# Patient Record
Sex: Female | Born: 1975
Health system: Southern US, Community
[De-identification: ages and names within clinical notes are randomized; demographics above are authoritative.]

## PROBLEM LIST (undated history)

## (undated) DIAGNOSIS — O24419 Gestational diabetes mellitus in pregnancy, unspecified control: Secondary | ICD-10-CM

## (undated) HISTORY — PX: DILATION AND CURETTAGE OF UTERUS: SHX78

## (undated) HISTORY — PX: WISDOM TOOTH EXTRACTION: SHX21

---

## 2005-07-24 ENCOUNTER — Other Ambulatory Visit: Admission: RE | Admit: 2005-07-24 | Discharge: 2005-07-24 | Payer: Self-pay | Admitting: Family Medicine

## 2005-09-28 ENCOUNTER — Encounter (INDEPENDENT_AMBULATORY_CARE_PROVIDER_SITE_OTHER): Payer: Self-pay | Admitting: Specialist

## 2005-09-28 ENCOUNTER — Ambulatory Visit (HOSPITAL_COMMUNITY): Admission: RE | Admit: 2005-09-28 | Discharge: 2005-09-28 | Payer: Self-pay | Admitting: Obstetrics & Gynecology

## 2006-04-13 ENCOUNTER — Inpatient Hospital Stay (HOSPITAL_COMMUNITY): Admission: AD | Admit: 2006-04-13 | Discharge: 2006-04-13 | Payer: Self-pay | Admitting: Obstetrics and Gynecology

## 2006-06-12 ENCOUNTER — Ambulatory Visit (HOSPITAL_COMMUNITY): Admission: RE | Admit: 2006-06-12 | Discharge: 2006-06-12 | Payer: Self-pay | Admitting: Obstetrics and Gynecology

## 2006-06-12 ENCOUNTER — Encounter (INDEPENDENT_AMBULATORY_CARE_PROVIDER_SITE_OTHER): Payer: Self-pay | Admitting: Specialist

## 2007-03-07 ENCOUNTER — Encounter: Admission: RE | Admit: 2007-03-07 | Discharge: 2007-03-07 | Payer: Self-pay | Admitting: Obstetrics and Gynecology

## 2007-06-02 ENCOUNTER — Inpatient Hospital Stay (HOSPITAL_COMMUNITY): Admission: AD | Admit: 2007-06-02 | Discharge: 2007-06-06 | Payer: Self-pay | Admitting: Pediatrics

## 2007-06-07 ENCOUNTER — Ambulatory Visit: Admission: RE | Admit: 2007-06-07 | Discharge: 2007-06-07 | Payer: Self-pay | Admitting: Obstetrics and Gynecology

## 2008-11-21 ENCOUNTER — Inpatient Hospital Stay (HOSPITAL_COMMUNITY): Admission: AD | Admit: 2008-11-21 | Discharge: 2008-11-21 | Payer: Self-pay | Admitting: Pediatrics

## 2010-09-06 NOTE — H&P (Signed)
NAMEHEIDIE, KRALL             ACCOUNT NO.:  [id number]   MEDICAL RECORD NO.:  [id number]          PATIENT TYPE:  INP   LOCATION:  9162                          [facility]  [facility]   PHYSICIAN:  Naima A. Dillard, M.D. DATE OF BIRTH:  1975-10-11   DATE OF ADMISSION:  06/02/2007  DATE OF DISCHARGE:                              HISTORY & PHYSICAL   This is a 35 year old, gravida 3, para 0-0-2-0 at 41-3/7 weeks who  presents for induction of labor secondary to post dates. She denies  leaking or bleeding and reports positive fetal movement.  The pregnancy  has been followed by the nurse midwife service and remarkable for:  1. SAB x2.  2. Group B strep in urine.  3. Previous LGA now AGA   ALLERGIES:  None.   OBSTETRIC HISTORY:  Remarkable for SAB in June 2007 at 10 weeks and SAB  in December 2007 at 6 weeks with no complications.   MEDICAL HISTORY:  Remarkable for childhood varicella.   SURGICAL HISTORY:  Remarkable for a D&C in 2007, cyst removal in 2007  and wisdom teeth in 1996.   FAMILY HISTORY:  Remarkable for an uncle with heart attack.  Mother and  grandmother with varicose veins.  Father with anemia, grandfather with  diabetes.  Father with Graves' disease and kidney cancer.  Grandmother  with Alzheimer's. Grandparents with stroke. Follows aunt with rheumatoid  arthritis.   GENETIC HISTORY:  Remarkable for a brother and grandmother with heart  murmur.   SOCIAL HISTORY:  The patient is married to Juliene Pina who is involved  and supportive.  She is of the [place] faith.  She denies any alcohol,  tobacco or drug use.   PRENATAL LABS:  Hemoglobin 14.9, platelets 232.  Blood type AB positive,  antibody screen negative, RPR nonreactive, rubella immune.  Hepatitis  negative, HIV negative.   HISTORY OF CURRENT PREGNANCY:  The patient entered care at 10 weeks.  She had an ultrasound to confirm dates.  She declined quad screen.  Ultrasound at 19 weeks was normal.  She had  some dysuria and a UTI at 26  weeks. That urinary tract infection grew out group B strep. A 1-hour  Glucola was 150 and a 3-hour GTT was normal.  She did see a nutritionist  because she was found to have an LGA baby at 90th percentile. A later  ultrasound at 37 weeks showed 74% growth and she presents today for  induction.   OBJECTIVE:  VITAL SIGNS:  Stable, afebrile.  HEENT:  Within normal limits.  Thyroid normal not enlarged.  CHEST:  Clear to auscultation.  HEART:  Regular rate and rhythm.  ABDOMEN:  Gravid, 40 cm, vertex Leopold's. EFM shows reactive fetal  heart rate with rare contractions.  Cervix is 1, 85% effaced, -1 to zero  station, vertex presentation, anterior and soft with a Bishop score of  10.  EXTREMITIES:  Within normal limits.   ASSESSMENT:  1. Intrauterine pregnancy at 41-3/7 weeks.  2. Post dates with favorable cervix.   PLAN:  1. Admit per Dr. Normand Sloop.  2. Routine CNM orders.  3. Will give 2 milliunits per minute Pitocin throughout the night and      advance in the morning.      Marie L. Williams, C.N.M.      Naima A. Normand Sloop, M.D.  Electronically Signed    MLW/MEDQ  D:  06/02/2007  T:  06/03/2007  Job:  [id number]

## 2010-09-09 NOTE — H&P (Signed)
NAMEANNALEIA, PENCE             ACCOUNT NO.:  [id number]   MEDICAL RECORD NO.:  [id number]          PATIENT TYPE:  AMB   LOCATION:                                [facility]   PHYSICIAN:  Naima A. Dillard, M.D. DATE OF BIRTH:  1975/12/01   DATE OF ADMISSION:  06/12/2006  DATE OF DISCHARGE:                              HISTORY & PHYSICAL   HISTORY OF PRESENT ILLNESS:  Mrs. Eberle is a 35 year old married white  female, para 0-0-2-0, who presents for operative laparoscopy to evaluate  a right adnexal mass.  Over the past year, patient has experienced 2  spontaneous abortions.  Her first was an intrauterine fetal demise at 66  weeks in early 2007, and most recently in December of 2007 she  miscarried at 4 weeks, 4 days gestation.  During her evaluations in  December, 2 ultrasound reports confirmed a persistent cystic right  adnexal mass measuring 4.8 x 3.7 x 3.7 cm.  This mass was associated  with some discomfort, but patient denied any urinary tract symptoms,  changes in bowel movements, vaginitis symptoms, chest pain, shortness of  breath or fever.  A follow-up ultrasound on May 21, 2006 again  demonstrated a right cystic structure measuring 4.3 x 3.5 x 4.9 cm.  There was no free fluid in the cul-de-sac nor septations or color  Doppler flow within the mass.  A CBC with differential was within normal  limits, and gonorrhea and chlamydia cultures were both negative.  Patient was given the options of observation or surgical exploration for  management of this right adnexal mass; however, she has chosen to  proceed with surgical intervention.   PAST MEDICAL HISTORY:  OB history:  Gravida 2, para 0-0-2-0.  Patient  has had 2 spontaneous abortions (see history of present illness).   GYNECOLOGICAL HISTORY:  Menarche 35 years old, last menstrual period  February 25, 2006.  Denies any history of sexually transmitted diseases  or abnormal Pap smears.  Most recent normal Pap smear was  April 2007.   MEDICAL HISTORY:  Scoliosis.   SURGICAL HISTORY:  2007 D&C, dilatation and curettage.   FAMILY HISTORY:  Thyroid disease, asthma, hypertension, cardiovascular  disease, diabetes mellitus and stroke.   SOCIAL HISTORY:  Patient is married, and she works at [employer].   HABITS:  She does not use tobacco or alcohol.   CURRENT MEDICATIONS:  Multivitamins.   ALLERGIES:  SHE HAS NO KNOWN DRUG ALLERGIES.   SOCIAL HISTORY:  She does not use tobacco or alcohol.   REVIEW OF SYSTEMS:  All negative, except as mentioned in history of  present illness.   PHYSICAL EXAMINATION:  VITAL SIGNS:  Blood pressure 110/80, weight 142  pounds, height 5 feet 6 inches tall.  GENERAL:  Neck:  There is no thyromegaly or cervical adenopathy.  HEART:  Regular rate and rhythm.  LUNGS:  Clear.  BACK:  No CVA tenderness.  ABDOMEN:  No tenderness masses or organomegaly.  EXTREMITIES:  No clubbing, cyanosis or edema.  PELVIC:  EGBUS is within normal limits.  Vagina is rugose.  Cervix is  nontender  without lesions.  Uterus appears normal size, shape and  consistency without tenderness.  Adnexa with no palpable tenderness or  masses.   IMPRESSION:  Right adnexal mass.   DISPOSITION:  A discussion was held with patient regarding the  indications for her procedure, along with this list which includes but  are not limited to reaction to anesthesia, damage to adjacent organs,  infection, excessive bleeding and a possible need to remove her right  tube and ovary.  Patient verbalized understanding of these risks and has  consented to proceed with an operational laparoscopy at [facility]  of [place] on June 12, 2006 at 10:00 a.m.      Elmira J. Adline Peals.      Naima A. Normand Sloop, M.D.  Electronically Signed    EJP/MEDQ  D:  06/07/2006  T:  06/07/2006  Job:  [id number]

## 2010-09-09 NOTE — Op Note (Signed)
NAMECHIARA, Ashley Ho             ACCOUNT NO.:  [id number]   MEDICAL RECORD NO.:  [id number]          PATIENT TYPE:  AMB   LOCATION:  SDC                           [facility]   PHYSICIAN:  Naima A. Dillard, M.D. DATE OF BIRTH:  Aug 03, 1975   DATE OF PROCEDURE:  06/12/2006  DATE OF DISCHARGE:                               OPERATIVE REPORT   PREOPERATIVE DIAGNOSIS:  Persistent right adnexal mass.   POSTOPERATIVE DIAGNOSIS:  Right simple cyst.   PROCEDURE:  Fluoroscopy, right tubal cystectomy and drainage of cyst.   SURGEON:  Naima A. Normand Sloop, M.D.   ASSISTANT:  Caren Griffins, C.N.M.   ANESTHESIA:  General.   SPECIMENS:  Right tubal cyst to pathology with fluid.   ESTIMATED BLOOD LOSS:  150 mL.   URINE OUTPUT:  200 mL.   INTRAVENOUS FLUIDS:  1200 mL.   COMPLICATIONS:  None.   DISPOSITION:  Patient to PACU in stable condition.   PROCEDURE IN DETAIL:  The patient was taken to the operating room where  she was given general anesthesia, placed in dorsal lithotomy position  and prepped and draped in normal sterile fashion.  A Foley catheter was  placed and a bivalved speculum was placed into the vagina.  The anterior  lip of the cervix was grasped with a single-toothed tenaculum and an  Acorn was attached to the tenaculum as a means to manipulate the uterus.  Attention was then turned to the umbilicus, where 5 mL of 0.25% Marcaine  with epinephrine was used right below the umbilicus.  A 2 mm incision  was made just below the umbilicus, and carried down through the fascia.  The fascia was incised in the midline and extended bilaterally.  The  peritoneum was identified and tented up sharply.  A pursestring suture  was placed around the fascia.  The Hasson was placed into the abdominal  cavity with anchoring to the suture and the fascia.  The abdomen was  insufflated with CO2 gas.  A laparoscope was placed and the findings  were as follows.  The patient's appendix was normal,  the patient's  abdominal anatomy was normal.  The patient's left tube and ovary were  normal.  Her posterior cul-de-sac, anterior cul-de-sac and uterus  appeared normal.  Her right ovary was normal.  In the mesosalpinx of the  tube was about a 4 cm simple-appearing cyst.  I put two, 5 mm trocars in  the right and left lower quadrant of the abdomen and there was a thin  layer of serosa around the cyst.  This was entered sharply with  Metzenbaums and extended away from the tube and the cyst was enucleated  out using counter pressure with two, atraumatic forceps and also  hydrodissection with the Nagat .  There was some bleeding in the  mesosalpinx area, which was made hemostatic with Gelfoam and Kleppinger  forceps.  The body of the tube and fimbriae were never burned or injured  in any way.  At the end of the case, I did take pictures to show that  once the cyst was enucleated from the  mesosalpinx, the right tube was  placed in the cul-de-sac, a needle was placed into the cyst and drained  of clear, straw fluid and the cyst was removed.  The operative port and  laparoscope let the air out and then re-insufflated to make sure there  was no bleeding along the mesosalpinx of the right tube.  Irrigation was  done.  Gelfoam was placed to the mid salpinx.  Sponge, lap and needle  counts were correct.  All trocars were removed with direct visualization  and hemostasis noted.  The umbilical port fascia was reapproximated by  tying the suture.  There was a small gap, so I put in another figure-of-  eight suture with 0 Vicryl.  The umbilical skin incision was closed with  3-0 Monocryl.  The small, 5 mm trocar skin incisions were closed with  Dermabond.  The tenaculum was removed and good hemostasis was noted.  The acorn was removed.  Sponge, lap and needle counts were correct.  The  patient went to the recovery room in stable condition.      Naima A. Normand Sloop, M.D.  Electronically Signed      NAD/MEDQ  D:  06/12/2006  T:  06/12/2006  Job:  [id number]

## 2010-09-09 NOTE — Discharge Summary (Signed)
NAMEPRAIRIE, STENBERG             ACCOUNT NO.:  [id number]   MEDICAL RECORD NO.:  [id number]          PATIENT TYPE:  INP   LOCATION:  9135                          [facility]   PHYSICIAN:  Janine Limbo, M.D.DATE OF BIRTH:  [birth year]   DATE OF ADMISSION:  06/02/2007  DATE OF DISCHARGE:  06/06/2007                               DISCHARGE SUMMARY   ADMITTING DIAGNOSES:  1. Intrauterine pregnancy at 41-3/7th's weeks.  2. Group B strep in urine.  3. History of spontaneous abortion x2.  4. Previous large for gestational age but now appropriate for      gestational age.  5. Bishop score equal to 10.   DISCHARGE DIAGNOSES:  1. Intrauterine pregnancy at term.  2. Status post vacuum extraction delivery.  3. Breast feeding and pumping.  4. Slightly low platelet count.  5. Postpartum anemia without hemodynamic instability.   PROCEDURES:  1. Epidural.  2. Vacuum extraction.   HOSPITAL COURSE:  The patient was admitted on February 8th for induction  of labor secondary to 41-3/7th's weeks and Bishop score equal to 10.  Ms. Deats is a 35 year old G3, P0-0-2-0 and on admission cervix was 1-  cm, 80-90% effaced, minus 1 to 0 vertex, anterior, and soft.  Fetal  heart tracing was reassuring and reactive.  The patient was having rare  uterine contractions.  Her prenatal care had been followed by CNM  service at [facility].   HISTORY:  Remarkable for:  1. SAB x2.  2. Group B strep in urine.  3. Previous LGA during pregnancy, however, now AGA.   The patient was observed overnight.  Her contractions at approximately  12:30 a.m. were between 4-7 minutes and were mild.  Fetal heart tracing  was reactive and the plan was made to allow the patient to sleep as much  as possible during the night and continue to observe for signs and  symptoms of labor.  At 7:40 a.m. Pitocin had been started and was on 4-milliunits at that  time.  The patient was comfortable with irregular contractions.   Cervix  was re-examined and was 1-cm, 90% vertex, and 0 station.  Fetal heart  rate was reactive without decels.  At 2:45 p.m. on the 9th, Pitocin was at 14-milliunits per minute.  Uterine contractions were at every 2-4 minutes with an irregular  pattern.  Fetal heart tracing remained reactive.  Cervix was 2-to-3-cm,  100% vertex, and 0 station  and the patient was requesting epidural.  At 4:45 p.m. on the 9th, the patient was comfortable after her epidural.  Fetal heart tracing continued to be reactive without decels.  Pitocin  had been decreased to 7-milliunits per minute while waiting for her  epidural.  Cervical exam, the patient was 5-to-6-cm dilated, 100%  vertex, 0 station.  Artifical rupture of membranes was performed by  Chip Boer L. Emilee Hero, C.N.M.  Clear fluid was noted with bloody show and an  IUPC was placed.  The plan was to continue to titrate Pitocin per  protocol and continue to observe for continued labor progression.  At 6 :25 p.m., the patient continued to  be comfortable with her  epidural.  Her MVUs were 160-170.  Negative CST.  Vaginal exam was  deferred.  Pitocin was 12-milliunits per minute and the plan was made to  continue to observe for uterine adequacy with MVUs greater than 180.  At 1945 hours on the 9th, the patient remained comfortable.  Vital signs  were stable.  She was afebrile.  Fetal heart tracing was beginning to  show some variables into the 70s with spontaneous recovery.  MVUs were  adequate for at least one hour.  Pitocin was at 16-milliunits per  minute.  Cervix was 9-cm, 100%, vertex, and 0.  The plan was made to  continue to observe.  At 2230 hours on the 9th, the patient was comfortable except for  occasional abdominal pain with fetal movement.  She did have a T-max of  her temperature at 100, however, at that time it was 98.5 after Tylenol.  Other vital signs were stable.  Fetal heart tracing was reassuring with  occasional mild early variables.   MVUs were adequate.  Cervix was  complete/complete.  Vertex plus 1 to plus 2 with some caput.  Plan was  made by Cam Hai, C.N.M. to allow for rest and descent for one hour  or for urge to push and then re-evaluate.  At 2345, the patient was feeling some rectal pressure.  Fetal heart  tracing was reassuring.  Vertex was at plus 2 station and pushing was  begun.  At 0100 hours on February 10th, the patient had been pushing for one  hour and 15 minutes.  Fetal heart rate was in the 150s with some  variables, having late components after contractions and pushing.  They  lasted for 30-40 seconds with a min into the 80s.  Dr. Estanislado Pandy was  notified and requested to evaluate.  Dr. Estanislado Pandy came to the bedside to assess the patient and fetal heart  tracing and noted late declarations over the last 30 minutes with  pushing.  She noted pushing to be efficient and baby starting to crown.  Thick meconium was noticed for the first time after her arrival.  Pitocin was at 16-milliunits per minute.  Fetal heart rate in the 140s  with good variability but with severe variables to min and 75-80 BPM.  Without pushing there was some improvement in the tracing.  The patient  was considered for a vacuum assisted delivery.  Risks and benefits were  reviewed by Dr. Estanislado Pandy.  Secondary to the fetal heart tracing with late  and variables, Dr. Estanislado Pandy easily placed vacuum.  The baby was plus 4  station with probable ROA presentation.  Traction was placed over 4  contractions with one popoff and vacuum-assisted delivery was noted at  0143 hours on February 10th, a viable female infant by the name of  Alanson Aly.  Mouth and nose were suctioned at peroneum and baby to  NICU team.  Apgar were 8 and 9 and weight was 8 pounds 5 ounces.  Cord  pH was 7.12.  Dr. Estanislado Pandy did perform a midline episiotomy with a  partial 3rd degree and those were repaired with a 2-0 Vicryl and a 3-0  Monopril.  Placenta was expelled  spontaneously and cord blood was  donated.  EBL was 350 mL and the patient was doing well and tolerated  procedure well.  Later that morning at 7:20 a.m., postpartum day zero, the patient was  doing well but very tired.  Plans breast-feeding.  Her vital  signs are  stable and she was afebrile.  Her physical exam was benign and routine  postpartum care was implemented.  By postpartum day #1, the patient was up ad lib.  Verbalized  consideration of Micronor for postpartum contraception but was not  completely decided.  She was breast feeding.  She was afebrile.  Hemoglobin was down to 9.4 from 13.6.  White count was stable at 11.4  and platelets were slightly decreased.  They were 128 from 172.  Physical exam was within normal limits.  Fundus was firm.  She had scant  lochia.  Perineum was healing.  Extremities were within normal limits.  Postpartum day #2, the patient was up at bedside.  She had her clothes  on and was ready to go.  Her pain was well controlled with Motrin every  6 hours.  She was utilizing sitz baths.  She reported some difficulty  with breast-feeding and per the patient's pediatrician, she had been  encouraged to supplement and to as well begin pumping.  The patient was scheduled to follow up for a weight check on Saturday  morning with the pediatrician.  The patient was tolerating a regular  diet, voiding without difficulty.  She was up ad lib.  She reported her  lochia was continuing to decrease and she was without dizziness or  syncope and complaining of her breast being sore.  Vital signs remained  stable and she was afebrile.  On physical exam breasts were soft and  nontender and intact.  Fundus was firm below umbilicus.  Peroneum was  healing.  Extremities were within normal limits.  The patient was deemed  to have received full benefit of her hospital stay and was discharged  home on postpartum day #2.   DISCHARGE CONDITION:  Good.   DISCHARGE TEACHING:  Per  CCOB pamphlet.  Warning signs and symptoms to  report were reviewed.   DISCHARGE MEDICATIONS:  1. Motrin 600 mg q.6 h. p.r.n. pain.  2. PreCare Premiere one tablet daily, which was her prenatal vitamin.   FOLLOWUP:  The patient to follow up in 6 weeks at [facility] or p.r.n.  The  patient was planning to defer contraception to that time.  She was  encouraged to obtain over-the-counter iron tablet and stool softener  p.r.n. and continue her prenatal vitamins.  Plan was made to draw at [facility]  at 6 weeks to re-evaluate platelet count.      Candice Denny Levy, [place]      Janine Limbo, M.D.  Electronically Signed    CHS/MEDQ  D:  06/16/2007  T:  06/16/2007  Job:  [id number]

## 2010-09-09 NOTE — Op Note (Signed)
Ashley Ho, LYCAN             ACCOUNT NO.:  [id number]   MEDICAL RECORD NO.:  [id number]          PATIENT TYPE:  AMB   LOCATION:  SDC                           [facility]   PHYSICIAN:  Genia Del, M.D.DATE OF BIRTH:  1975-10-30   DATE OF PROCEDURE:  09/28/2005  DATE OF DISCHARGE:                                 OPERATIVE REPORT   PREOPERATIVE DIAGNOSIS:  Missed abortion, 9+ weeks.   POSTOPERATIVE DIAGNOSIS:  Missed abortion, 9+ weeks.   OPERATION/PROCEDURE:  Dilation and evacuation.   SURGEON:  Genia Del, M.D.   ANESTHESIOLOGIST:  Quillian Quince, M.D.   DESCRIPTION OF PROCEDURE:  Under MAC analgesia, the patient in the lithotomy  position. She is prepped with Betadine on the suprapubic, vulvar and vaginal  areas.  The bladder is catheterized and the patient is draped as usual.  The  vaginal exam reveals an anteverted uterus about 9 cm, mobile, no adnexal  masses.  Cervix is long and closed.  No vaginal bleeding.  We insert the  speculum in the vagina.  A paracervical block is done with 0.25% Marcaine  plain, total of 20 mL at 4 and 8 o'clock.  We grasp the anterior lip of the  cervix with a tenaculum.  Dilatation of the cervix with Hegar dilators up to  #33 without difficulty.  We used a #9 curved suction curet to suction the  intrauterine cavity.  Products of conception are sent to pathology.  We then  used a sharp curet to systemically curettage the intrauterine cavity.  We  then used the suction curet once more to evacuate any products of conception  and blood clots.  The uterus contracts well on the instrument.  Hemostasis  is adequate.  We use silver nitrate on the anterior lip of the cervix to  control hemostasis.  All instruments are removed.  The estimated blood loss  was minimal.  No complications occurred and the patient was brought to the  recovery room in good status.      Genia Del, M.D.  Electronically Signed     ML/MEDQ   D:  09/28/2005  T:  09/29/2005  Job:  [id number]

## 2011-01-13 LAB — CBC
HCT: 26.9 — ABNORMAL LOW
MCHC: 36.2 — ABNORMAL HIGH
MCV: 89.4
Platelets: 128 — ABNORMAL LOW
Platelets: 172
RDW: 12.6
RDW: 12.9

## 2011-01-13 LAB — RPR: RPR Ser Ql: NONREACTIVE

## 2011-04-21 LAB — RPR: RPR: NONREACTIVE

## 2011-04-21 LAB — GC/CHLAMYDIA PROBE AMP, GENITAL
Chlamydia: NEGATIVE
Gonorrhea: NEGATIVE

## 2011-04-25 NOTE — L&D Delivery Note (Addendum)
Delivery Note   At 2:55 AM a viable female was delivered via Vaginal, Spontaneous Delivery upon arrival to [facility]U via EMS.  APGAR: ,9, 9  Infant remains skin-skin Placenta status: Intact, Spontaneous.  Cord: 3 vessels with the following complications: .   Anesthesia: Local  Episiotomy: None Lacerations: 2nd degree  Suture Repair: 3.0 vicryl rapide  Wiliam Ke, CNM in to finish repair  LILLARD,SHELLEY M 06/08/2011, 3:33 AM   0330 -here to assess patient after precipitous delivery - called from triage staff after delivery at  Repair initiated by Sanda Klein CNM - stand-by from [facility] for precipitous delivery upon arrival to [facility]U via EMS. + intense pain - tense with difficult assessment of laceration Nubain 10 mg Sub-Q / additional 1% lidocaine local.  Assessment of laceration - inaccurate re-approximation of vaginal tissue to perineum. Sutures removed. Additional lidocaine 1% to deeper tissues and skin edges. Additional glove for rectal - no disruption of capsule or anus / asymmetrical 2nd degree to level of rectal capsule / left sulcus / right and left labial deep and wide lacerations.  Closure of vaginal sulcus with 3-0 vicryl locked fashion for hemostasis.  Closure of labial LAC with individual 4-0 subcuticular to re-approximate to identify landmarks for perineal closure. 3-0 vicryl interrupted for perineal muscle and close of deep space left assymetry reapproximated.  4-0 vicryl subcuticular skin closure  + marked edema and ecchymosis of vaginal and perineal tissues. No hemorrhoids.  Marlinda Mike CNM 06/08/2011 at 0430

## 2011-05-22 LAB — HIV ANTIBODY (ROUTINE TESTING W REFLEX): HIV: NONREACTIVE

## 2011-06-08 ENCOUNTER — Encounter (HOSPITAL_COMMUNITY): Payer: Self-pay | Admitting: *Deleted

## 2011-06-08 ENCOUNTER — Inpatient Hospital Stay (HOSPITAL_COMMUNITY)
Admission: AD | Admit: 2011-06-08 | Discharge: 2011-06-09 | DRG: 372 | Disposition: A | Discharge status: Home / Self Care | Payer: BC Managed Care – PPO | Source: Ambulatory Visit | Attending: Obstetrics | Admitting: Obstetrics

## 2011-06-08 DIAGNOSIS — O34219 Maternal care for unspecified type scar from previous cesarean delivery: Principal | ICD-10-CM | POA: Diagnosis present

## 2011-06-08 DIAGNOSIS — O99814 Abnormal glucose complicating childbirth: Secondary | ICD-10-CM | POA: Diagnosis present

## 2011-06-08 HISTORY — DX: Gestational diabetes mellitus in pregnancy, unspecified control: O24.419

## 2011-06-08 MED ORDER — OXYCODONE-ACETAMINOPHEN 5-325 MG PO TABS: 1.0000 | ORAL_TABLET | ORAL | Status: DC | PRN

## 2011-06-08 MED ORDER — IBUPROFEN 800 MG PO TABS
800.0000 mg | ORAL_TABLET | Freq: Three times a day (TID) | ORAL | Status: DC
  Administered 2011-06-08 – 2011-06-09 (×4): 800 mg via ORAL
  Filled 2011-06-08 (×4): qty 1

## 2011-06-08 MED ORDER — BENZOCAINE-MENTHOL 20-0.5 % EX AERO: 1.0000 "application " | INHALATION_SPRAY | CUTANEOUS | Status: DC | PRN

## 2011-06-08 MED ORDER — NALBUPHINE SYRINGE 5 MG/0.5 ML
10.0000 mg | INJECTION | Freq: Once | INTRAMUSCULAR | Status: DC
  Filled 2011-06-08: qty 1

## 2011-06-08 MED ORDER — LANOLIN HYDROUS EX OINT: TOPICAL_OINTMENT | CUTANEOUS | Status: DC | PRN

## 2011-06-08 MED ORDER — NALBUPHINE SYRINGE 5 MG/0.5 ML
10.0000 mg | INJECTION | INTRAMUSCULAR | Status: DC | PRN
  Administered 2011-06-08: 10 mg via SUBCUTANEOUS
  Filled 2011-06-08: qty 1

## 2011-06-08 MED ORDER — SIMETHICONE 80 MG PO CHEW: 80.0000 mg | CHEWABLE_TABLET | ORAL | Status: DC | PRN

## 2011-06-08 MED ORDER — LIDOCAINE HCL (PF) 1 % IJ SOLN
INTRAMUSCULAR | Status: AC
  Administered 2011-06-08: 30 mL
  Filled 2011-06-08: qty 30

## 2011-06-08 MED ORDER — WITCH HAZEL-GLYCERIN EX PADS: 1.0000 "application " | MEDICATED_PAD | CUTANEOUS | Status: DC | PRN

## 2011-06-08 MED ORDER — DIBUCAINE 1 % RE OINT: 1.0000 "application " | TOPICAL_OINTMENT | RECTAL | Status: DC | PRN

## 2011-06-08 MED ORDER — DIPHENHYDRAMINE HCL 25 MG PO CAPS: 25.0000 mg | ORAL_CAPSULE | Freq: Four times a day (QID) | ORAL | Status: DC | PRN

## 2011-06-08 MED ORDER — SENNOSIDES-DOCUSATE SODIUM 8.6-50 MG PO TABS
2.0000 | ORAL_TABLET | Freq: Every day | ORAL | Status: DC
  Administered 2011-06-08: 2 via ORAL

## 2011-06-08 MED ORDER — IBUPROFEN 600 MG PO TABS
600.0000 mg | ORAL_TABLET | Freq: Once | ORAL | Status: AC
  Administered 2011-06-08: 600 mg via ORAL
  Filled 2011-06-08: qty 1

## 2011-06-08 MED ORDER — OXYCODONE-ACETAMINOPHEN 5-325 MG PO TABS
1.0000 | ORAL_TABLET | Freq: Once | ORAL | Status: AC
  Administered 2011-06-08: 1 via ORAL
  Filled 2011-06-08: qty 1

## 2011-06-08 NOTE — Progress Notes (Signed)
INTERVAL NOTE:  S:  Sitting in bed, BFing, min cramping, no voids, small bleed, denies HA/NV/dizziness  O:  VSS, AAO x 3, NAD  FF bellow U  Small lochia  A / P:   PPD #0  Stable post partum  Routine PP orders  Karema Tocci, CNM, MSN  06/08/2011 9:29 AM

## 2011-06-08 NOTE — H&P (Signed)
  OB ADMISSION/ HISTORY & PHYSICAL:  Admission Date: 06/08/2011  2:53 AM  Admit Diagnosis: 38 weeks / VBAC - precipitous delivery upon arrival to hospital   Ashley Ho is a 36 y.o. female presenting for delivery of 3rd child. Onset of labor ~ midnight. Labored at home - did not call provider until doula arrived and called EMS. SROM 0145 with MSF at home just prior to EMS arrival. Delivery upon arrival to hospital attended by stand-by CNM.  Prenatal History: G1P0   EDC : 06/18/2011, by Other Basis  Prenatal care at [facility]   Prenatal course complicated by GDM / previous CS (planning TOLAC)  Prenatal Labs: ABO, Rh:   AB positive Antibody:  negative Rubella:  Immune RPR:   NR HBsAg:   Negative HIV:   NR GBS:   negative 1 hr Glucola : abnormal   Medical / Surgical History :  Past medical history: No past medical history on file.   Past surgical history: No past surgical history on file. CS x 1 ( two layer closure)  Family History: No family history on file.   Social History:  does not have a smoking history on file. She does not have any smokeless tobacco history on file. Her alcohol and drug histories not on file.   Allergies: Review of patient's allergies indicates no known allergies.    Current Medications at time of admission:  Prenatal vitamin daily  Review of Systems: Feeling cramping  Pain in bottom  Physical Exam:  General: calm / NAD Heart:RR Lungs:unlabored Abdomen:uterus firm at umbilicus Extremities: no edema   Assessment: 38 weeks -SVD / successful VBAC  Plan:  Admit Postpartum care  [facility] 06/08/2011, 4:41 AM

## 2011-06-08 NOTE — Progress Notes (Signed)
Pt arrival into room # 6 at 0252 per EMS with urge to push, transferred over to bed with assist. S. Lillard,CNM on unit and asked to come into room 6 to assist delivery, SVE at 0255. Call to T Bailey,CNM at 0255, and notified of pt arrival and delivery. Will be in to see pt now.

## 2011-06-09 ENCOUNTER — Encounter (HOSPITAL_COMMUNITY): Payer: Self-pay

## 2011-06-09 LAB — CBC
HCT: 31.7 % — ABNORMAL LOW (ref 36.0–46.0)
Hemoglobin: 10.6 g/dL — ABNORMAL LOW (ref 12.0–15.0)
RDW: 13.5 % (ref 11.5–15.5)
WBC: 10 10*3/uL (ref 4.0–10.5)

## 2011-06-09 MED ORDER — OXYCODONE-ACETAMINOPHEN 5-325 MG PO TABS: 1.0000 | ORAL_TABLET | Freq: Four times a day (QID) | ORAL | Status: AC | PRN

## 2011-06-09 MED ORDER — IBUPROFEN 600 MG PO TABS: 600.0000 mg | ORAL_TABLET | Freq: Four times a day (QID) | ORAL | Status: DC | PRN

## 2011-06-09 MED ORDER — DOCUSATE SODIUM 100 MG PO CAPS: 100.0000 mg | ORAL_CAPSULE | Freq: Two times a day (BID) | ORAL | Status: AC

## 2011-06-09 MED ORDER — TETANUS-DIPHTH-ACELL PERTUSSIS 5-2.5-18.5 LF-MCG/0.5 IM SUSP: 0.5000 mL | Freq: Once | INTRAMUSCULAR | Status: DC

## 2011-06-09 NOTE — Progress Notes (Signed)
UR chart review completed.  

## 2011-06-09 NOTE — Progress Notes (Signed)
Patient ID: Ashley Ho, female   DOB: 09-Apr-1976, 36 y.o.   MRN: [id number] PPD # 1  Subjective: Pt reports feeling well, but sore/ Pain controlled with prescription NSAID's including ibuprofen and narcotic analgesics including percocet Tolerating po/ Voiding without problems/ No n/v Bleeding is moderate Newborn info:  Information for the patient's newborn:  Quanita, Barona [[id number]]  female  / circ today/ Feeding: breast   Objective:  VS: Blood pressure 102/66, pulse 83, temperature 98 F (36.7 C), temperature source Oral, resp. rate 18    Basename 06/09/11 0525  WBC 10.0  HGB 10.6*  HCT 31.7*  PLT 152    Blood type: --/Positive/-- (01/28 0000) Rubella:   Immune    Physical Exam:  General: A & O x 3  alert, cooperative and no distress CV: Regular rate and rhythm Resp: clear Abdomen: soft, nontender, normal bowel sounds Uterine Fundus: firm, below umbilicus, nontender Perineum: good approximation; mod edema; notable ecchymosis; ice pack remains in place Lochia: moderate Ext: edema trace and Homans sign is negative, no sign of DVT   A/P: PPD # 1/ [id number]/ S/P: SVD with 2nd degree repair Stable for early d/c home RX: Percocet 5/325 1 - 2 tabs po every 6 hrs prn pain  #30 No refill Ibuprofen 600mg  po Q 6 hrs prn pain #30 Refill x 1 Colace 100mg  po BID #60 PNV 1 po QD #30 Ref x 6 Doing well Routine pp visit in 6 weeks   Demetrius Revel, MSN, [facility] 06/09/2011, 9:32 AM

## 2011-06-09 NOTE — Discharge Summary (Signed)
Obstetric Discharge Summary Reason for Admission: onset of labor and term gestation Prenatal Procedures: ultrasound Intrapartum Procedures: spontaneous vaginal delivery and VBAC and Precipitous delivery Postpartum Procedures: none Complications-Operative and Postpartum: 2nd degree perineal laceration Hemoglobin  Date Value Range Status  06/09/2011 10.6* 12.0-15.0 (g/dL) Final     HCT  Date Value Range Status  06/09/2011 31.7* 36.0-46.0 (%) Final    Discharge Diagnoses: Term Pregnancy-delivered and VBAC/Precipitous delivery  Discharge Information: Date: 06/09/2011 Activity: pelvic rest Diet: routine Medications: PNV, Ibuprofen, Colace and Percocet Condition: stable Instructions: refer to practice specific booklet Discharge to: home   Newborn Data: Live born female on 06/08/11 Birth Weight: 6 lb 12 oz (3062 g) APGAR: 9, 9  Home with mother.  Tevis Dunavan K 06/09/2011, 9:38 AM

## 2013-02-02 ENCOUNTER — Ambulatory Visit: Payer: BC Managed Care – PPO

## 2013-02-02 ENCOUNTER — Ambulatory Visit: Payer: BC Managed Care – PPO | Admitting: Emergency Medicine

## 2013-02-02 VITALS — BP 124/72 | HR 92 | Temp 100.2°F | Resp 16 | Ht 66.0 in | Wt 144.8 lb

## 2013-02-02 DIAGNOSIS — R509 Fever, unspecified: Secondary | ICD-10-CM

## 2013-02-02 DIAGNOSIS — N912 Amenorrhea, unspecified: Secondary | ICD-10-CM

## 2013-02-02 DIAGNOSIS — J019 Acute sinusitis, unspecified: Secondary | ICD-10-CM

## 2013-02-02 LAB — COMPREHENSIVE METABOLIC PANEL
ALT: 10 U/L (ref 0–35)
AST: 16 U/L (ref 0–37)
Albumin: 4.2 g/dL (ref 3.5–5.2)
Alkaline Phosphatase: 97 U/L (ref 39–117)
BUN: 11 mg/dL (ref 6–23)
Calcium: 9 mg/dL (ref 8.4–10.5)
Chloride: 103 mEq/L (ref 96–112)
Potassium: 4.8 mEq/L (ref 3.5–5.3)
Sodium: 136 mEq/L (ref 135–145)
Total Protein: 7.1 g/dL (ref 6.0–8.3)

## 2013-02-02 LAB — POCT CBC
Granulocyte percent: 56.8 %G (ref 37–80)
Hemoglobin: 13.4 g/dL (ref 12.2–16.2)
MCH, POC: 29.1 pg (ref 27–31.2)
MID (cbc): 0.5 (ref 0–0.9)
MPV: 9.2 fL (ref 0–99.8)
POC Granulocyte: 3.4 (ref 2–6.9)
POC MID %: 8 %M (ref 0–12)
Platelet Count, POC: 245 10*3/uL (ref 142–424)
RBC: 4.61 M/uL (ref 4.04–5.48)

## 2013-02-02 LAB — POCT SEDIMENTATION RATE: POCT SED RATE: 21 mm/hr (ref 0–22)

## 2013-02-02 LAB — POCT URINALYSIS DIPSTICK
Blood, UA: NEGATIVE
Glucose, UA: NEGATIVE
Nitrite, UA: NEGATIVE
Protein, UA: NEGATIVE
Spec Grav, UA: >=1.03
Urobilinogen, UA: 0.2
pH, UA: 5.5

## 2013-02-02 LAB — POCT URINE PREGNANCY: Preg Test, Ur: NEGATIVE

## 2013-02-02 MED ORDER — FLUTICASONE PROPIONATE 50 MCG/ACT NA SUSP: 2.0000 | Freq: Every day | NASAL | Status: DC

## 2013-02-02 MED ORDER — AMOXICILLIN-POT CLAVULANATE 875-125 MG PO TABS: 1.0000 | ORAL_TABLET | Freq: Two times a day (BID) | ORAL | Status: DC

## 2013-02-02 NOTE — Patient Instructions (Signed)

## 2013-02-02 NOTE — Progress Notes (Addendum)
Subjective:  This chart was scribed for Collene Gobble, MD by Arlan Organ, ED Scribe. This patient was seen in room Room 2 and the patient's care was started 12:32 PM.   Patient ID: Ashley Ho, female    DOB: 09-29-1975, 37 y.o.   MRN: [id number]  HPI HPI Comments: Ashley Ho is a 37 y.o. female with a hx of mononucleosis who presents to [facility] complaining of a fever that started 2 weeks ago. Pt reports her fever being 101.6 at its worst. Pt states her symptoms started as sinus pressure. She states her son come down with a viral infection with similar symptoms. Pt states she has not traveled, but her husband does travel often. She states the last place her traveled was [facility]. Pt does not currently take any medication, and pt states she does not have any current medical complaints. Pt denies a cough, sore throat, fatigue. Pt is currently not on any birth control. LMP a couple weeks ago, and states she is not currently pregnant.   Review of Systems  Constitutional: Positive for fever.  HENT: Negative for sore throat.   Respiratory: Negative for cough.     Past Medical History  Diagnosis Date  . Gestational diabetes     History   Social History  . Marital Status: Married    Spouse Name: N/A    Number of Children: N/A  . Years of Education: N/A   Occupational History  . Not on file.   Social History Main Topics  . Smoking status: Never Smoker   . Smokeless tobacco: Not on file  . Alcohol Use: No  . Drug Use: No  . Sexual Activity: Not Currently   Other Topics Concern  . Not on file   Social History Narrative  . No narrative on file    Past Surgical History  Procedure Laterality Date  . Cesarean section    . Wisdom tooth extraction         Objective:   Physical Exam  Constitutional: She is oriented to person, place, and time. She appears well-developed and well-nourished. No distress.  HENT:  Head: Normocephalic.  Eyes: Conjunctivae are normal.  Pupils are equal, round, and reactive to light. No scleral icterus.  scleral icterus   Neck: Normal range of motion. Neck supple. No thyromegaly present.  Cardiovascular: Normal rate and regular rhythm.  Exam reveals no gallop and no friction rub.   No murmur heard. Pulmonary/Chest: Effort normal and breath sounds normal. No respiratory distress. She has no wheezes. She has no rales.  Abdominal: Soft. Bowel sounds are normal. She exhibits no distension. There is no tenderness. There is no rebound.  Noticeable spleen tip  Musculoskeletal: Normal range of motion.  Neurological: She is alert and oriented to person, place, and time.  Skin: Skin is warm and dry. No rash noted.  Psychiatric: She has a normal mood and affect. Her behavior is normal.   Results for orders placed in visit on 02/02/13  POCT URINALYSIS DIPSTICK      Result Value Range   Color, UA yellow     Clarity, UA clear     Glucose, UA neg     Bilirubin, UA neg     Ketones, UA trace     Spec Grav, UA >=1.030     Blood, UA neg     pH, UA 5.5     Protein, UA neg     Urobilinogen, UA 0.2  Nitrite, UA neg     Leukocytes, UA Negative    POCT CBC      Result Value Range   WBC 6.0  4.6 - 10.2 K/uL   Lymph, poc 2.1  0.6 - 3.4   POC LYMPH PERCENT 35.2  10 - 50 %L   MID (cbc) 0.5  0 - 0.9   POC MID % 8.0  0 - 12 %M   POC Granulocyte 3.4  2 - 6.9   Granulocyte percent 56.8  37 - 80 %G   RBC 4.61  4.04 - 5.48 M/uL   Hemoglobin 13.4  12.2 - 16.2 g/dL   HCT, POC 16.1  09.6 - 47.9 %   MCV 91.2  80 - 97 fL   MCH, POC 29.1  27 - 31.2 pg   MCHC 31.9  31.8 - 35.4 g/dL   RDW, POC 04.5     Platelet Count, POC 245  142 - 424 K/uL   MPV 9.2  0 - 99.8 fL  [facility] reading (PRIMARY) by  Dr.Brighid Koch there is complete occlusion of both maxillary sinuses. Chest x-ray shows no acute disease there are bilateral nipple shadows present .      Assessment & Plan:  Patient to be treated with Mucinex Augmentin and Flonase

## 2013-02-04 LAB — EPSTEIN-BARR VIRUS VCA ANTIBODY PANEL
EBV EA IgG: <5 U/mL (ref <9.0)
EBV NA IgG: <3 U/mL (ref <18.0)
EBV VCA IgG: 10.5 U/mL (ref <18.0)

## 2015-01-08 ENCOUNTER — Telehealth: Payer: Self-pay

## 2015-01-08 LAB — OB RESULTS CONSOLE HIV ANTIBODY (ROUTINE TESTING): HIV: NONREACTIVE

## 2015-01-08 LAB — OB RESULTS CONSOLE GC/CHLAMYDIA
Chlamydia: NEGATIVE
Gonorrhea: NEGATIVE

## 2015-01-08 LAB — OB RESULTS CONSOLE ABO/RH: "RH Type ": POSITIVE

## 2015-01-08 LAB — OB RESULTS CONSOLE HEPATITIS B SURFACE ANTIGEN: Hepatitis B Surface Ag: NEGATIVE

## 2015-01-08 LAB — OB RESULTS CONSOLE RPR: RPR: NONREACTIVE

## 2015-01-08 LAB — OB RESULTS CONSOLE RUBELLA ANTIBODY, IGM: Rubella: IMMUNE

## 2015-01-08 LAB — OB RESULTS CONSOLE ANTIBODY SCREEN: Antibody Screen: NEGATIVE

## 2015-01-08 NOTE — Telephone Encounter (Signed)
Error. Wrong pt clicked

## 2015-04-25 NOTE — L&D Delivery Note (Signed)
  Delivery Note First Stage: Labor onset: 2030 Augmentation : none Analgesia /Anesthesia intrapartum: nitrous oxide SROM at 2030  Second Stage: Complete dilation at 2352 Onset of pushing at 2352 FHR second stage category 2  Delivery of a viable female at 0013 by CNM in LOA position loose nuchal cord x1 slid down body at birth Cord double clamped after cessation of pulsation, cut by FOB Cord blood sample collected   Third Stage[facility] 3 [facility] @ 0017 Placenta disposition: hospital disposal Uterine tone firm / bleeding scant  2nd perineal laceration identified with vaginal extension Anesthesia for repair: 1% xylocaine and Nitrous Oxide Repair 3-0 vicryl vaginal repair & perineal muscle with 4-0 vicryl subcuticular perineal Est. Blood Loss (mL): 100  Complications: none Mom to postpartum.  Baby to Couplet care / Skin to Skin.  Newborn: Birth Weight: 8 pounds - 4 oz Apgar Scores: 9-9 Feeding planned: breast  Marlinda MikeBAILEY, Donatello Kleve CNM, MSN, FACNM 08/19/2015, 12:41 AM

## 2015-07-14 LAB — OB RESULTS CONSOLE GBS: STREP GROUP B AG: NEGATIVE

## 2015-08-18 ENCOUNTER — Inpatient Hospital Stay (HOSPITAL_COMMUNITY)
Admission: AD | Admit: 2015-08-18 | Discharge: 2015-08-20 | DRG: 775 | Disposition: A | Discharge status: Home / Self Care | Payer: BLUE CROSS/BLUE SHIELD | Source: Ambulatory Visit | Attending: Obstetrics & Gynecology | Admitting: Obstetrics & Gynecology

## 2015-08-18 ENCOUNTER — Encounter (HOSPITAL_COMMUNITY): Payer: Self-pay | Admitting: *Deleted

## 2015-08-18 DIAGNOSIS — O34219 Maternal care for unspecified type scar from previous cesarean delivery: Secondary | ICD-10-CM | POA: Diagnosis present

## 2015-08-18 DIAGNOSIS — O34211 Maternal care for low transverse scar from previous cesarean delivery: Secondary | ICD-10-CM | POA: Diagnosis present

## 2015-08-18 DIAGNOSIS — O480 Post-term pregnancy: Secondary | ICD-10-CM | POA: Diagnosis present

## 2015-08-18 DIAGNOSIS — Z3A41 41 weeks gestation of pregnancy: Secondary | ICD-10-CM | POA: Diagnosis not present

## 2015-08-18 DIAGNOSIS — O4202 Full-term premature rupture of membranes, onset of labor within 24 hours of rupture: Secondary | ICD-10-CM | POA: Diagnosis present

## 2015-08-18 DIAGNOSIS — Z98891 History of uterine scar from previous surgery: Secondary | ICD-10-CM

## 2015-08-18 LAB — CBC
HCT: 32.7 % — ABNORMAL LOW (ref 36.0–46.0)
Hemoglobin: 11 g/dL — ABNORMAL LOW (ref 12.0–15.0)
MCH: 28.6 pg (ref 26.0–34.0)
MCHC: 33.6 g/dL (ref 30.0–36.0)
MCV: 84.9 fL (ref 78.0–100.0)
Platelets: 184 10*3/uL (ref 150–400)
RBC: 3.85 MIL/uL — ABNORMAL LOW (ref 3.87–5.11)
RDW: 13.8 % (ref 11.5–15.5)
WBC: 10.6 10*3/uL — ABNORMAL HIGH (ref 4.0–10.5)

## 2015-08-18 LAB — TYPE AND SCREEN
ABO/RH(D): AB POS
Antibody Screen: NEGATIVE

## 2015-08-18 LAB — POCT FERN TEST: POCT Fern Test: POSITIVE

## 2015-08-18 MED ORDER — OXYCODONE-ACETAMINOPHEN 5-325 MG PO TABS: 2.0000 | ORAL_TABLET | ORAL | Status: DC | PRN

## 2015-08-18 MED ORDER — OXYCODONE-ACETAMINOPHEN 5-325 MG PO TABS: 1.0000 | ORAL_TABLET | ORAL | Status: DC | PRN

## 2015-08-18 MED ORDER — OXYTOCIN 10 UNIT/ML IJ SOLN
10.0000 [IU] | Freq: Once | INTRAMUSCULAR | Status: DC
  Filled 2015-08-18: qty 1

## 2015-08-18 MED ORDER — LACTATED RINGERS IV SOLN: 500.0000 mL | INTRAVENOUS | Status: DC | PRN

## 2015-08-18 MED ORDER — ACETAMINOPHEN 325 MG PO TABS: 650.0000 mg | ORAL_TABLET | ORAL | Status: DC | PRN

## 2015-08-18 MED ORDER — LIDOCAINE HCL (PF) 1 % IJ SOLN
30.0000 mL | INTRAMUSCULAR | Status: DC | PRN
  Administered 2015-08-19: 30 mL via SUBCUTANEOUS
  Filled 2015-08-18: qty 30

## 2015-08-18 MED ORDER — CITRIC ACID-SODIUM CITRATE 334-500 MG/5ML PO SOLN: 30.0000 mL | ORAL | Status: DC | PRN

## 2015-08-18 NOTE — MAU Note (Signed)
Patient presents stating that she is [redacted] weeks pregnant with c/o ruptured membranes at 2030 today - clear fluid and irregular contractions. Fetus active. Denies bleeding.

## 2015-08-18 NOTE — H&P (Signed)
  OB ADMISSION/ HISTORY & PHYSICAL:  Admission Date: 08/18/2015  9:10 PM  Admit Diagnosis: 41.3 weeks / SROM / previous VBAC for repeat VBAC  Aliene AltesJennifer R Calcaterra is a 40 y.o. female presenting for SROm ~ 8:30pm.  Prenatal History: [id number]   EDC : 08/08/2015, by Last Menstrual Period  [facility]  Primary Ob Provider: Fredric MareBailey CNM Prenatal course complicated by hx previous CS for breech / 3 SVB with last pregnancy precipitous labor with successful VBAC / hx GDM previous pregnancy #3  Prenatal Labs: ABO, Rh:  AB positive Antibody:  negative Rubella:   Immune RPR:   NR HBsAg:   Negative HIV:   NR GTT: NL GBS:   Negative  EFW ~9 pounds (pelvis proven 8-9) / vtx / AFI 7  Medical / Surgical History :  Past medical history:  Past Medical History  Diagnosis Date  . Gestational diabetes     Past surgical history:  Past Surgical History  Procedure Laterality Date  . Cesarean section    . Wisdom tooth extraction     Family History:  Family History  Problem Relation Age of Onset  . Anesthesia problems Neg Hx     Social History:  reports that she has never smoked. She does not have any smokeless tobacco history on file. She reports that she does not drink alcohol or use illicit drugs.  Allergies: Review of patient's allergies indicates no known allergies.   Current Medications at time of admission:  Prior to Admission medications   Medication Sig Start Date End Date Taking? Authorizing Provider  Prenatal Vit-Fe Fumarate-FA (MULTIVITAMIN-PRENATAL) 27-0.8 MG TABS Take 1 tablet by mouth daily.   Yes Historical Provider, MD   Review of Systems: Active FM onset of ctx @ 2100currently every 10 minutes LOF  / SROM @ 2030 bloody show small pink  Physical Exam:  VS: Blood pressure 147/82, pulse 68, temperature 99.5 F (37.5 C), temperature source Oral, resp. rate 16, height 5\' 6"  (1.676 m), weight 90.719 kg (200 lb), last menstrual period  11/01/2014.  General: alert and oriented, appears comfortable Heart: RRR Lungs: Clear lung fields Abdomen: Gravid, soft and non-tender, non-distended / uterus: gravid Extremities: trace edema / multiple varicosities  Genitalia / VE:  5cm  /90% / vtx -1 / forewaters present - AROM'd  FHR: baseline rate 130 / variability moderate / accelerations + / no decelerations TOCO: Q8-10 minutes  Assessment: [redacted] weeks gestation latent stage of labor after SROM Previous VBAC for repeat VBAC FHR category 1   Plan:  Expectant management  Dr Seymour BarsLavoie notified of admission / plan of care  Marlinda MikeBAILEY, TANYA CNM, MSN, [facility] 08/18/2015, 9:53 PM

## 2015-08-19 ENCOUNTER — Encounter (HOSPITAL_COMMUNITY): Payer: Self-pay

## 2015-08-19 DIAGNOSIS — O34219 Maternal care for unspecified type scar from previous cesarean delivery: Secondary | ICD-10-CM | POA: Diagnosis present

## 2015-08-19 LAB — CBC
HCT: 31.9 % — ABNORMAL LOW (ref 36.0–46.0)
Hemoglobin: 10.7 g/dL — ABNORMAL LOW (ref 12.0–15.0)
MCH: 28.2 pg (ref 26.0–34.0)
MCHC: 33.5 g/dL (ref 30.0–36.0)
MCV: 84.2 fL (ref 78.0–100.0)
Platelets: 187 10*3/uL (ref 150–400)
RBC: 3.79 MIL/uL — ABNORMAL LOW (ref 3.87–5.11)
RDW: 13.8 % (ref 11.5–15.5)
WBC: 14.6 10*3/uL — ABNORMAL HIGH (ref 4.0–10.5)

## 2015-08-19 LAB — RPR: RPR Ser Ql: NONREACTIVE

## 2015-08-19 MED ORDER — WITCH HAZEL-GLYCERIN EX PADS: 1.0000 "application " | MEDICATED_PAD | CUTANEOUS | Status: DC | PRN

## 2015-08-19 MED ORDER — ACETAMINOPHEN 325 MG PO TABS: 650.0000 mg | ORAL_TABLET | ORAL | Status: DC | PRN

## 2015-08-19 MED ORDER — IBUPROFEN 600 MG PO TABS
600.0000 mg | ORAL_TABLET | Freq: Four times a day (QID) | ORAL | Status: DC
  Administered 2015-08-19 – 2015-08-20 (×7): 600 mg via ORAL
  Filled 2015-08-19 (×6): qty 1

## 2015-08-19 MED ORDER — SIMETHICONE 80 MG PO CHEW: 80.0000 mg | CHEWABLE_TABLET | ORAL | Status: DC | PRN

## 2015-08-19 MED ORDER — COCONUT OIL OIL
1.0000 "application " | TOPICAL_OIL | Status: DC | PRN
  Filled 2015-08-19: qty 120

## 2015-08-19 MED ORDER — BENZOCAINE-MENTHOL 20-0.5 % EX AERO
1.0000 | INHALATION_SPRAY | CUTANEOUS | Status: DC | PRN
Start: 2015-08-19 — End: 2015-08-20
  Administered 2015-08-19: 1 via TOPICAL
  Filled 2015-08-19 (×2): qty 56

## 2015-08-19 MED ORDER — OXYCODONE-ACETAMINOPHEN 5-325 MG PO TABS
1.0000 | ORAL_TABLET | ORAL | Status: DC | PRN
  Administered 2015-08-19: 1 via ORAL
  Administered 2015-08-19: 2 via ORAL
  Filled 2015-08-19: qty 2
  Filled 2015-08-19: qty 1

## 2015-08-19 MED ORDER — DIBUCAINE 1 % RE OINT
1.0000 "application " | TOPICAL_OINTMENT | RECTAL | Status: DC | PRN
  Filled 2015-08-19: qty 1

## 2015-08-19 NOTE — Progress Notes (Signed)
Patient ID: Ashley Ho, female   DOB: 1975/09/19, 40 y.o.   MRN: [id number] INTERVAL NOTE: S/P VBAC with 2nd degree Laceration  S:   Sitting in bed, skin-to-skin with baby, min cramping, (+) voids, small bleed, denies HA/NV/dizziness  O:   VSS, AAO x 3, NAD  U-even  Scant lochia  A / P:   PPD #0  Stable post partum  Routine PP orders  Kenard GowerAWSON, Reis Pienta, M, MSN, CNM 08/19/2015, 11:15 AM

## 2015-08-19 NOTE — Lactation Note (Signed)
This note was copied from a baby'Ho chart. Lactation Consultation Note  Initial visit made.  Breastfeeding consultation services and support information given and reviewed.  Mom is experienced breastfeeding her 3 previous babies.  She states newborn is latching and feeding well.  Reviewed first 24 hours and cluster feeding on 2-3 days.  Recommended good waking techniques and breast massage during feeding.  No questions at present.  Encouraged to call with concerns prn.  Patient Name: Girl Ashley BottcherJennifer Ho ZOXWR'UToday'Ho Date: 08/19/2015     Maternal Data    Feeding Feeding Type: Breast Fed Length of feed: 25 min  LATCH Score/Interventions                      Lactation Tools Discussed/Used     Consult Status      Huston FoleyMOULDEN, Ashley Ho 08/19/2015, 2:09 PM

## 2015-08-20 MED ORDER — OXYCODONE-ACETAMINOPHEN 5-325 MG PO TABS: 1.0000 | ORAL_TABLET | ORAL | Status: DC | PRN

## 2015-08-20 MED ORDER — IBUPROFEN 600 MG PO TABS: 600.0000 mg | ORAL_TABLET | Freq: Four times a day (QID) | ORAL | Status: DC

## 2015-08-20 MED ORDER — TETANUS-DIPHTH-ACELL PERTUSSIS 5-2.5-18.5 LF-MCG/0.5 IM SUSP
0.5000 mL | Freq: Once | INTRAMUSCULAR | Status: AC
  Administered 2015-08-20: 0.5 mL via INTRAMUSCULAR

## 2015-08-20 NOTE — Lactation Note (Signed)
This note was copied from a baby's chart. Lactation Consultation Note  Patient Name: Girl Ledora BottcherJennifer Boggio ZOXWR'UToday's Date: 08/20/2015 Reason for consult: Follow-up assessment   Follow up with Exp BF mom of 34 hour old infant. Infant with 8 BF for 10-30 minutes, 3 voids, 2 stools, and 3 emesis in last 24 hours. Infant weight 7 lb 15.2 oz with 4% weight loss since birth. LATCH Scores 9 by bedside RN's.  Mom reports her breasts are feeling a little bit fuller today. She reports she is having nipple tenderness with initial latch. Advised her to use hand expressed colostrum to nipples post feed. Reviewed use of Coconut oil/olive oil to nipples.   Reviewed all BF information in Taking Care of Baby and Me Booklet. Reviewed Engorgement prevention/treatment, comfort pumping and pre pumping with mom. Reviewed I/O and enc mom to maintain feeding log and take to Ped visit on Sunday. Mom voiced understanding. Reviewed LC Brochure, mom aware of OP Services, BF Support Groups, and LC phone #. Mom without questions/concerns at this time, she is aware she can call with questions/concerns prn.    Maternal Data Formula Feeding for Exclusion: No Does the patient have breastfeeding experience prior to this delivery?: Yes  Feeding Feeding Type: Breast Fed  LATCH Score/Interventions Latch: Grasps breast easily, tongue down, lips flanged, rhythmical sucking.  Audible Swallowing: A few with stimulation  Type of Nipple: Everted at rest and after stimulation  Comfort (Breast/Nipple): Soft / non-tender     Hold (Positioning): No assistance needed to correctly position infant at breast.  LATCH Score: 9  Lactation Tools Discussed/Used Pump Review: Milk Storage   Consult Status Consult Status: Complete Follow-up type: Call as needed    Ed BlalockSharon S Sebastyan Snodgrass 08/20/2015, 10:51 AM

## 2015-08-20 NOTE — Discharge Summary (Signed)
Obstetric Discharge Summary  Reason for Admission: onset of labor Prenatal Procedures: NST, BPP, SONO EFW (9lb) - post-dates protocol Intrapartum Procedures: spontaneous vaginal delivery Postpartum Procedures: none Complications-Operative and Postpartum: 2nd degree perineal laceration HEMOGLOBIN  Date Value Ref Range Status  08/19/2015 10.7* 12.0 - 15.0 g/dL Final  16/10/960410/03/2013 54.013.4 12.2 - 16.2 g/dL Final   HCT  Date Value Ref Range Status  08/19/2015 31.9* 36.0 - 46.0 % Final   HCT, POC  Date Value Ref Range Status  02/02/2013 42.0 37.7 - 47.9 % Final    Physical Exam:  General: alert, cooperative and no distress Lochia: appropriate Uterine Fundus: firm Incision: healing well DVT Evaluation: No evidence of DVT seen on physical exam.  Discharge Diagnoses: Term Pregnancy-delivered / repeat VBAC  Discharge Information: Date: 08/20/2015 Activity: pelvic rest Diet: routine Medications: PNV, Ibuprofen and Percocet Condition: stable Instructions: refer to practice specific booklet Discharge to: home Follow-up Information    Follow up with Ashley Ho, Ashley Ho, Ashley Ho. Schedule an appointment as soon as possible for a visit in 6 weeks.   Specialty:  Obstetrics and Gynecology   Contact information:   [address] [place] [id number] [phone]       Newborn Data: Live born female  Birth Weight: 8 lb 4.1 oz (3745 g) APGAR: 9,   Home with mother.  Ashley Ho, Ashley Ho 08/20/2015, 1:04 PM

## 2015-08-20 NOTE — Progress Notes (Signed)
PPD 1 SVD with repeat VBAC  S:  Reports feeling well             Tolerating po/ No nausea or vomiting             Bleeding is light             Pain controlled withMotrin             Up ad lib / ambulatory / voiding QS  Newborn breast feeding O:               VS: BP 116/73 mmHg  Pulse 64  Temp(Src) 98.3 F (36.8 C) (Oral)  Resp 18  Ht 5\' 6"  (1.676 m)  Wt 90.719 kg (200 lb)  BMI 32.30 kg/m2  SpO2 97%  LMP 11/01/2014  Breastfeeding? Unknown   LABS:              Recent Labs  08/18/15 2208 08/19/15 0613  WBC 10.6* 14.6*  HGB 11.0* 10.7*  PLT 184 187               Blood type: --/--/AB POS (04/26 2208)  Rubella: Immune (09/16 0000)                        Physical Exam:             Alert and oriented X3  Abdomen: soft, non-tender, non-distended              Fundus: firm, non-tender, U-1  Perineum: no edema  Lochia: light  Extremities: no edema, no calf pain or tenderness    A: PPD # 1   Doing well - stable status  P: Routine post partum orders  DC home - WOB booklet - instructions reviewed  Marlinda MikeBAILEY, Masayuki Sakai CNM, MSN, FACNM 08/20/2015, 1:02 PM

## 2016-08-17 ENCOUNTER — Ambulatory Visit (INDEPENDENT_AMBULATORY_CARE_PROVIDER_SITE_OTHER): Payer: BLUE CROSS/BLUE SHIELD | Admitting: Cardiovascular Disease

## 2016-08-17 ENCOUNTER — Encounter: Payer: Self-pay | Admitting: Cardiovascular Disease

## 2016-08-17 VITALS — BP 134/70 | HR 70 | Ht 66.0 in | Wt 152.8 lb

## 2016-08-17 DIAGNOSIS — R011 Cardiac murmur, unspecified: Secondary | ICD-10-CM | POA: Diagnosis not present

## 2016-08-17 DIAGNOSIS — Z8249 Family history of ischemic heart disease and other diseases of the circulatory system: Secondary | ICD-10-CM

## 2016-08-17 NOTE — Progress Notes (Signed)
Cardiology Office Note Date:  08/18/2016   ID:  Ashley Ho, DOB [birth year], MRN [id number]  PCP:  Wilfrid Lund, PA  Cardiologist:  Tonny Bollman, MD    Chief Complaint  Patient presents with  . New Patient (Initial Visit)    family hx aortic dissection     History of Present Illness: Ashley Ho is a 41 y.o. female who presents for screening evaluation.  The patient's mother, who is 59 years old, just died of a ruptured thoracic aortic aneurysm. She had no documented history of cardiovascular disease. She did have a history of hypertension that was well controlled. She is a lifelong nonsmoker. She had no connective tissue disorder, no known history of bicuspid aortic valve, and no history of Marfan's syndrome or other familial disease.  The patient is healthy. She has no history of hypertension, hyperlipidemia, or any chronic medical problems. She has 4 children who are all healthy. She feels well and has no shortness of breath, chest pain, heart palpitations, or leg swelling.  There is no family history of sudden cardiac death. The patient's paternal grandfather had strokes in his 42s. Her paternal grandmother died of coronary artery disease at age 24. Maternal grandfather lived to be 49 without cardiac disease. Maternal grandmother lived to be 73 and died of Alzheimer's disease.   Past Medical History:  Diagnosis Date  . Gestational diabetes    Past Surgical History:  Procedure Laterality Date  . CESAREAN SECTION    . DILATION AND CURETTAGE OF UTERUS    . WISDOM TOOTH EXTRACTION      No current outpatient prescriptions on file.   No current facility-administered medications for this visit.     Allergies:   Patient has no known allergies.   Social History:  The patient  reports that she has never smoked. She has never used smokeless tobacco. She reports that she does not drink alcohol or use drugs.   Family History:  The patient's  family history  includes Healthy in her brother; Hyperlipidemia in her mother; Hypertension in her mother.   ROS:  Please see the history of present illness.   All other systems are reviewed and negative.   PHYSICAL EXAM: VS:  BP 134/70   Pulse 70   Ht  (1.676 m)   Wt 152 lb 12.8 oz (69.3 kg)   BMI 24.66 kg/m  , BMI Body mass index is 24.66 kg/m. GEN: Well nourished, well developed, in no acute distress  HEENT: normal  Neck: no JVD, no masses. No carotid bruits Cardiac: RRR with a 2/6 systolic murmur at the LLSB     Respiratory:  clear to auscultation bilaterally, normal work of breathing GI: soft, nontender, nondistended, + BS MS: no deformity or atrophy  Ext: no pretibial edema, pedal pulses 2+= bilaterally Skin: warm and dry, no rash Neuro:  Strength and sensation are intact Psych: euthymic mood, full affect  EKG:  EKG is ordered today. The ekg ordered today shows normal sinus rhythm 70 bpm, within normal limits.  Recent Labs: No results found for requested labs within last 8760 hours.   Lipid Panel  No results found for: CHOL, TRIG, HDL, CHOLHDL, VLDL, LDLCALC, LDLDIRECT    Wt Readings from Last 3 Encounters:  08/17/16 152 lb 12.8 oz (69.3 kg)  08/18/15 200 lb (90.7 kg)  02/02/13 144 lb 12.8 oz (65.7 kg)     Cardiac Studies Reviewed: none  ASSESSMENT AND PLAN: 1.  Heart murmur: Suspect  benign flow murmur. Will check echocardiogram to rule out any valvular pathology.  2. Family history of aortic aneurysm/dissection: I think it would be reasonable to screen this patient with an MRA of the chest, abdomen, and pelvis to evaluate her entire aorta and make sure that she does not have an inherited aortopathy. An echocardiogram will be done to evaluate for a bicuspid aortic valve which can be associated with aortic aneurysm. The patient does have a heart murmur as outlined. The patient and her husband are counseled at length about the many uncertainties with her mother's recent death  and the implications for the patient and her children. Will check an echo and MRA as detailed. I discussed her case with Dr. Jomarie Longs who is our genetic specialist, and unless the patient has some abnormality discovered on her test there is probably no role for genetic testing.  Current medicines are reviewed with the patient today.  The patient does not have concerns regarding medicines.  Labs/ tests ordered today include:   Orders Placed This Encounter  Procedures  . MR MRA CHEST W WO CONTRAST  . MR MRA ABDOMEN W WO CONTRAST  . MR MRA PELVIS W WO CONTRAST  . Basic metabolic panel  . EKG 12-Lead  . ECHOCARDIOGRAM COMPLETE    Disposition:   FU prn  Signed, Tonny Bollman, MD  08/18/2016 1:17 PM    [facility] [address] [place], [place], [place]  [address] Phone: [phone]; Fax: [phone]

## 2016-08-17 NOTE — Patient Instructions (Addendum)
Medication Instructions:  No current medications.   Labwork: Your physician recommends that you return for lab work prior to MRA: BMP  Testing/Procedures: Your physician has requested that you have an echocardiogram. Echocardiography is a painless test that uses sound waves to create images of your heart. It provides your doctor with information about the size and shape of your heart and how well your heart's chambers and valves are working. This procedure takes approximately one hour. There are no restrictions for this procedure.  Your physician has recommended an MRA of the Chest/Abdomen and Pelvis.  This will be performed at [facility].   Follow-Up: You have been referred to Dr Sidney Ace for genetic evaluation (will await results of Echo and MRA to determine if this is needed).    Any Other Special Instructions Will Be Listed Below (If Applicable).     If you need a refill on your cardiac medications before your next appointment, please call your pharmacy.

## 2016-08-29 ENCOUNTER — Ambulatory Visit (HOSPITAL_COMMUNITY): Payer: BLUE CROSS/BLUE SHIELD | Attending: Cardiovascular Disease

## 2016-08-29 ENCOUNTER — Other Ambulatory Visit: Payer: BLUE CROSS/BLUE SHIELD

## 2016-08-29 ENCOUNTER — Other Ambulatory Visit: Payer: Self-pay

## 2016-08-29 DIAGNOSIS — Z8249 Family history of ischemic heart disease and other diseases of the circulatory system: Secondary | ICD-10-CM

## 2016-08-29 DIAGNOSIS — R011 Cardiac murmur, unspecified: Secondary | ICD-10-CM

## 2016-08-29 DIAGNOSIS — I341 Nonrheumatic mitral (valve) prolapse: Secondary | ICD-10-CM | POA: Diagnosis not present

## 2016-08-29 DIAGNOSIS — I340 Nonrheumatic mitral (valve) insufficiency: Secondary | ICD-10-CM | POA: Insufficient documentation

## 2016-08-30 ENCOUNTER — Telehealth: Payer: Self-pay | Admitting: Cardiovascular Disease

## 2016-08-30 LAB — BASIC METABOLIC PANEL
BUN/Creatinine Ratio: 23 (ref 9–23)
BUN: 20 mg/dL (ref 6–24)
CALCIUM: 9.5 mg/dL (ref 8.7–10.2)
CHLORIDE: 102 mmol/L (ref 96–106)
CO2: 24 mmol/L (ref 18–29)
Creatinine, Ser: 0.87 mg/dL (ref 0.57–1.00)
GFR calc non Af Amer: 84 mL/min/{1.73_m2} (ref >59)
GFR, EST AFRICAN AMERICAN: 96 mL/min/{1.73_m2} (ref >59)
GLUCOSE: 86 mg/dL (ref 65–99)
Potassium: 4.6 mmol/L (ref 3.5–5.2)
Sodium: 143 mmol/L (ref 134–144)

## 2016-08-30 NOTE — Telephone Encounter (Signed)
I spoke with Dr Excell Seltzerooper and verbal order given for pt to take Valium 10mg  one hour prior to MRA.  I contacted the pt and made her aware of this information and she made me aware that she is breast feeding.  I spoke with Prudence DavidsonKelley Auten Pharm D and valium will cross into breast milk.  She advised that the pt breast feed prior to taking Valium and then pump and dump 2 hours after taking medication.  At 4 hours out from Valium the pt can breast feed. The pt was advised to watch for increased sleepiness and decreased respirations in child after breast feeding. The pt verbalized understanding of these instructions and [place] [facility]. The pt was advised that she cannot drive after taking this medication and her spouse will take her to and from the hospital tomorrow. The pt will contact the office with any additional questions or concerns.

## 2016-08-30 NOTE — Telephone Encounter (Signed)
New Message   Pt call requesting to speak with RN to get a prescription for Valium. Please call back to discuss

## 2016-08-31 ENCOUNTER — Other Ambulatory Visit: Payer: Self-pay

## 2016-08-31 ENCOUNTER — Ambulatory Visit (HOSPITAL_COMMUNITY): Payer: BLUE CROSS/BLUE SHIELD

## 2016-08-31 ENCOUNTER — Ambulatory Visit (HOSPITAL_COMMUNITY)
Admission: RE | Admit: 2016-08-31 | Discharge: 2016-08-31 | Disposition: A | Discharge status: Home / Self Care | Payer: BLUE CROSS/BLUE SHIELD | Source: Ambulatory Visit | Attending: Cardiovascular Disease | Admitting: Cardiovascular Disease

## 2016-08-31 ENCOUNTER — Telehealth: Payer: Self-pay | Admitting: Cardiovascular Disease

## 2016-08-31 DIAGNOSIS — Z8249 Family history of ischemic heart disease and other diseases of the circulatory system: Secondary | ICD-10-CM | POA: Insufficient documentation

## 2016-08-31 DIAGNOSIS — K769 Liver disease, unspecified: Secondary | ICD-10-CM | POA: Insufficient documentation

## 2016-08-31 MED ORDER — GADOBENATE DIMEGLUMINE 529 MG/ML IV SOLN
15.0000 mL | Freq: Once | INTRAVENOUS | Status: AC
  Administered 2016-08-31: 15 mL via INTRAVENOUS

## 2016-08-31 NOTE — Telephone Encounter (Signed)
Did not need this encounter °

## 2016-08-31 NOTE — Telephone Encounter (Signed)
Call transferred into triage by operator from MRI Dept @[facility] . They stated that this patient had an order for MRI of chest, abdomen, pelvis, but the pelvis order was not needed. Per Dr. Deanne CofferHassell, vascular radiologist for MRI, the pelvis order needs to be cancelled. Pelvis order cancelled by triage.

## 2016-09-04 ENCOUNTER — Telehealth: Payer: Self-pay | Admitting: Cardiovascular Disease

## 2016-09-04 NOTE — Telephone Encounter (Signed)
I spoke with the pt and gave her preliminary results of MRA in regards to no evidence of aortic aneurysm or dissection.  I made her aware that I am waiting on the final review from Dr Excell Seltzerooper.  The pt also asked if she needs any further follow-up in the next few years for mild MVP and mild MR.  At the pt's appointment with Dr Excell Seltzerooper we left her follow-up as PRN. I will send this information to Dr Excell Seltzerooper for review.

## 2016-09-04 NOTE — Telephone Encounter (Signed)
Follow Up:   Pt would like her MRI results from last Thursday please.

## 2016-09-13 NOTE — Telephone Encounter (Signed)
Reviewed findings with patient over the telephone. Recommend a follow-up MRI of the abdomen in 6 months as recommended.

## 2016-09-13 NOTE — Telephone Encounter (Signed)
F/u message  Pt calling back. Please call back to discuss

## 2017-05-11 ENCOUNTER — Telehealth: Payer: Self-pay | Admitting: Cardiovascular Disease

## 2017-05-11 DIAGNOSIS — K769 Liver disease, unspecified: Secondary | ICD-10-CM

## 2017-05-11 NOTE — Telephone Encounter (Signed)
New Message   Patient is calling because she was under the impression that Dr. Excell Seltzerooper wanted her to have a MRI in six months. But nothing is showing in the system of that request. The only thing that shows is a follow up visit request to see Dr. Excell Seltzerooper. Please call.

## 2017-05-14 NOTE — Telephone Encounter (Signed)
Per 09/04/16 phone note,  "Tonny Bollmanooper, Michael, MD  to [place] CoachBrown, Lauren W, RN    11:49 AM  Note    Reviewed findings with patient over the telephone. Recommend a follow-up MRI of the abdomen in 6 months as recommended.      Previous MRI ordered was cancelled several months ago (see 5/10 phone note).  ABD MRI ordered for scheduling. Patient agrees with treatment plan.

## 2017-05-30 ENCOUNTER — Ambulatory Visit (HOSPITAL_COMMUNITY)
Admission: RE | Admit: 2017-05-30 | Discharge: 2017-05-30 | Disposition: A | Discharge status: Home / Self Care | Payer: BLUE CROSS/BLUE SHIELD | Source: Ambulatory Visit | Attending: Cardiovascular Disease | Admitting: Cardiovascular Disease

## 2017-05-30 DIAGNOSIS — K7689 Other specified diseases of liver: Secondary | ICD-10-CM | POA: Insufficient documentation

## 2017-05-30 DIAGNOSIS — K769 Liver disease, unspecified: Secondary | ICD-10-CM | POA: Insufficient documentation

## 2017-05-30 MED ORDER — GADOXETATE DISODIUM 0.25 MMOL/ML IV SOLN
7.0000 mL | Freq: Once | INTRAVENOUS | Status: AC
  Administered 2017-05-30: 7 mL via INTRAVENOUS

## 2018-10-07 IMAGING — MR MR ABDOMEN WO/W CM
10 of 20 series · 20 of 48 positions shown · IV contrast (7 EOVIST)
Comparison: Chest MRI 08/31/2016

CLINICAL DATA: Indeterminate hepatic lesions incidentally found on
chest MRI.

EXAM:
MRI ABDOMEN WITHOUT AND WITH CONTRAST
TECHNIQUE: Multiplanar multisequence MR imaging of the abdomen was performed
both before and after the administration of intravenous contrast.
CONTRAST:  7mL EOVIST GADOXETATE DISODIUM 0.25 MOL/L IV SOLN

[Series 5: cor ssfse rt · coronal · 6.0mm · 0.74mm/px · 1 of 31 slices shown]
[im 1/31]
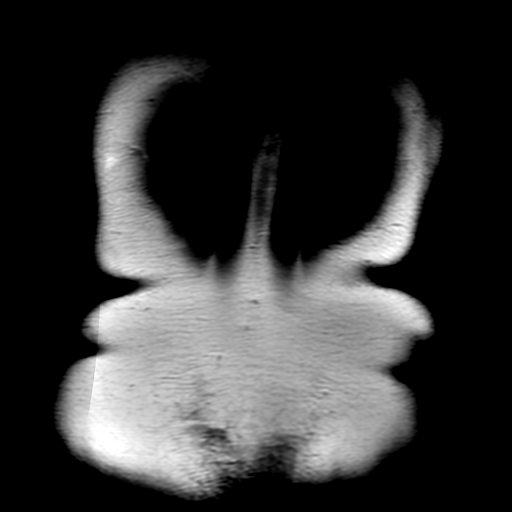

[Series 12: ax ssfse rt · axial · 6.0mm · 0.74mm/px · 1 of 39 slices shown]
[im 1/39]
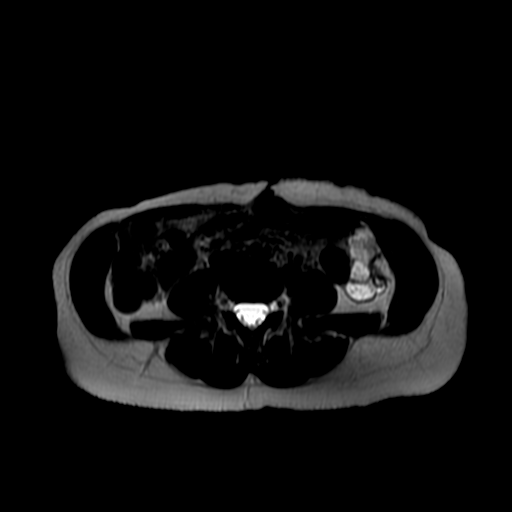

[Series 13: T2 fat-sat · axial · 6.0mm · 0.74mm/px · 1 of 39 slices shown]
[im 1/39]
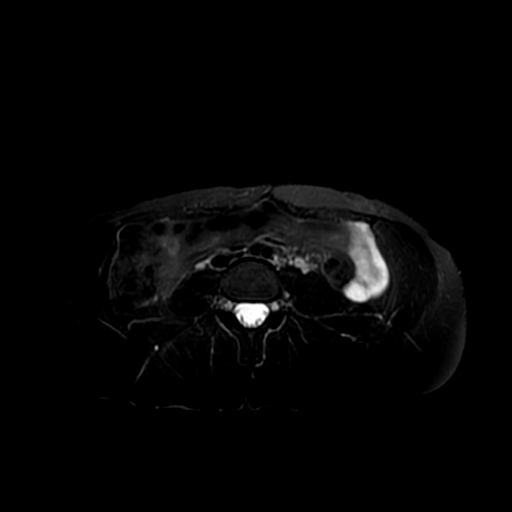

[Series 14: DWI b500 · axial · 8.0mm · 1.48mm/px · 1 of 46 slices shown]
[im 1/46]
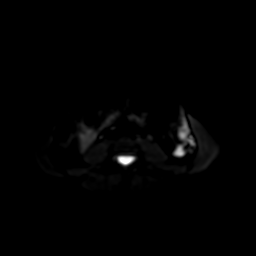

[Series 15: bSSFP · axial · 6.0mm · 0.74mm/px · 1 of 40 slices shown]
[im 1/40]
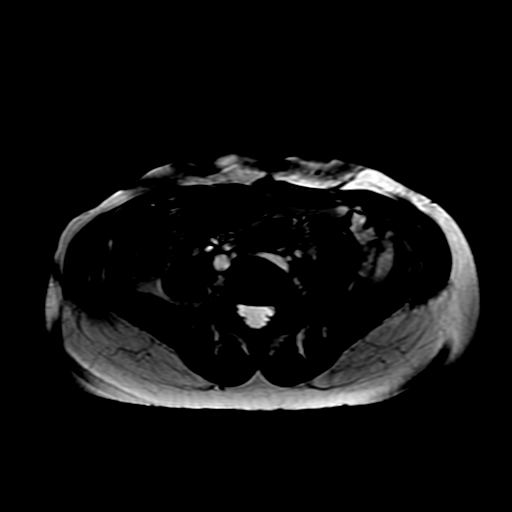

[Series 16: T1 dynamic post-contrast · axial · 4.0mm · 0.74mm/px · z∈[-82,+155]mm · 3 of 120 slices shown (1 of 3)]
[im 1/120]
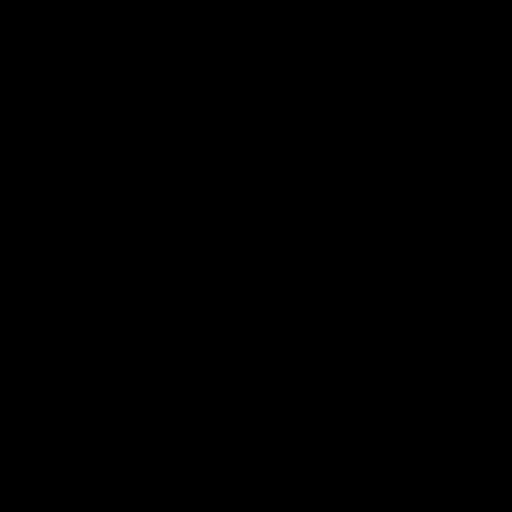
[im 60/120]
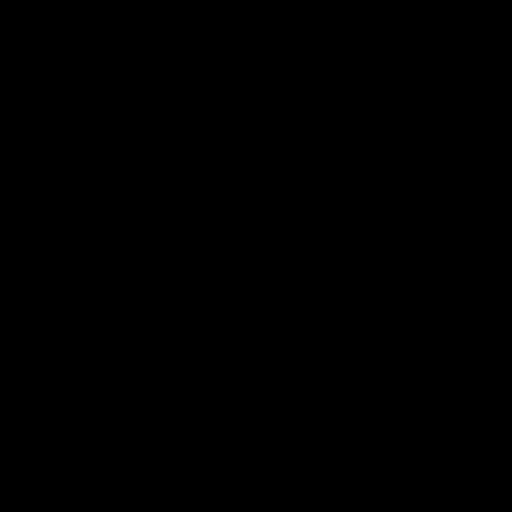
[im 120/120]
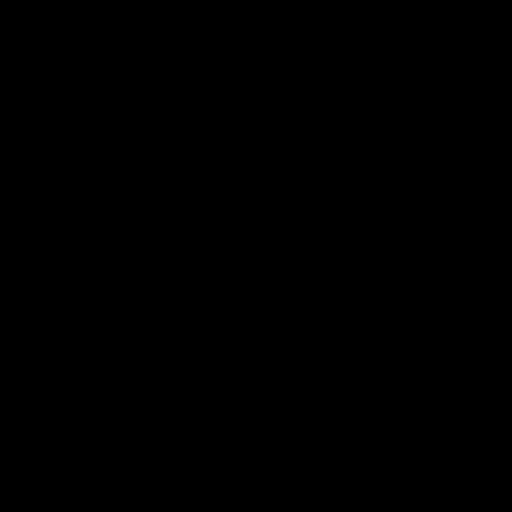

[Series 801: T1 dynamic · axial · 4.0mm · 0.74mm/px · z∈[-82,+155]mm · 3 of 120 slices shown (1 of 2)]
[im 1/120]
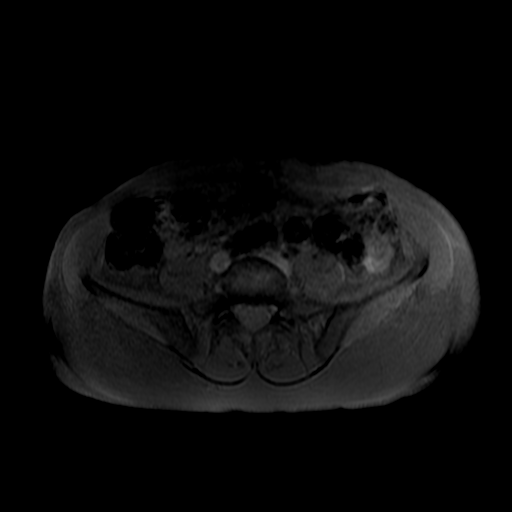
[im 60/120]
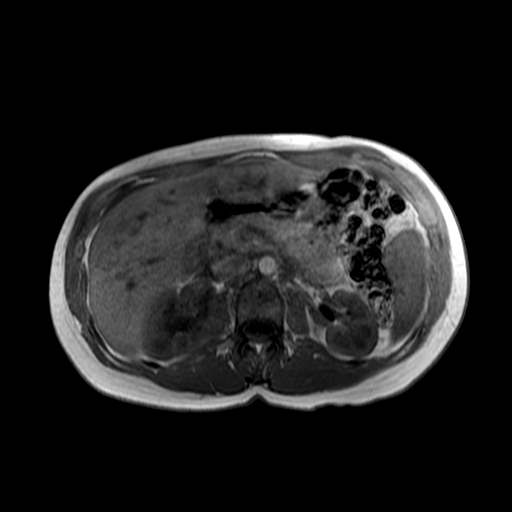
[im 120/120]
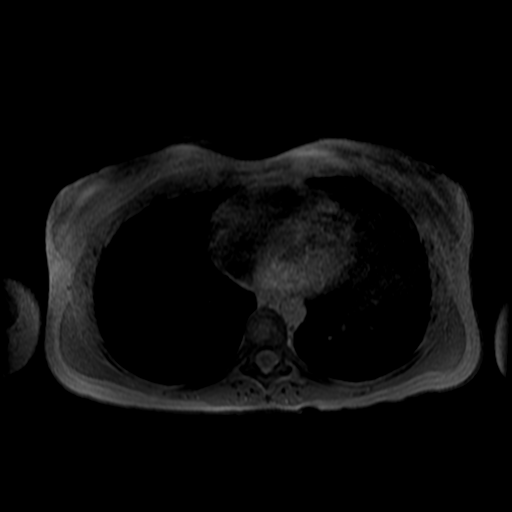

[Series 802: T1 dynamic · axial · 4.0mm · 0.74mm/px · z∈[-82,+155]mm · 3 of 120 slices shown (2 of 2)]
[im 1/120]
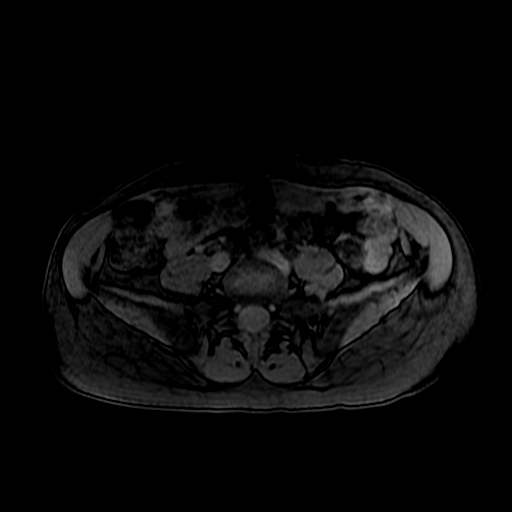
[im 60/120]
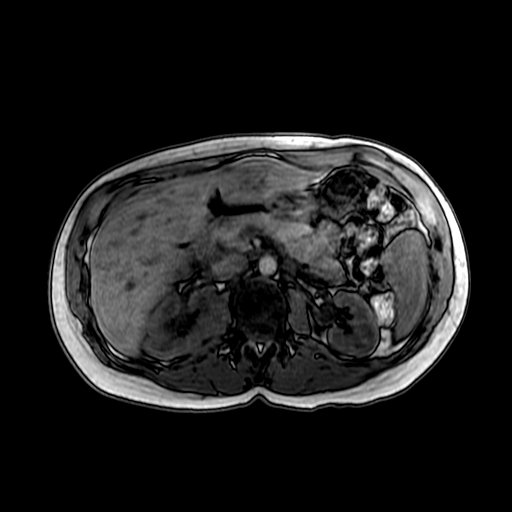
[im 120/120]
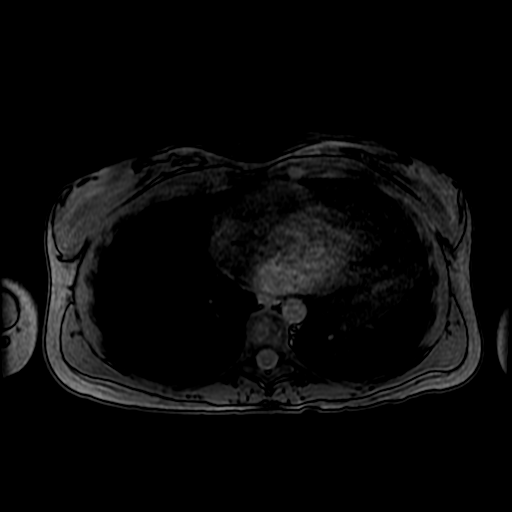

[Series 900: T1 dynamic post-contrast · axial · non-contrast · 4.0mm · 0.74mm/px · z∈[-82,+155]mm · 3 of 120 slices shown (2 of 3)]
[im 1/120]
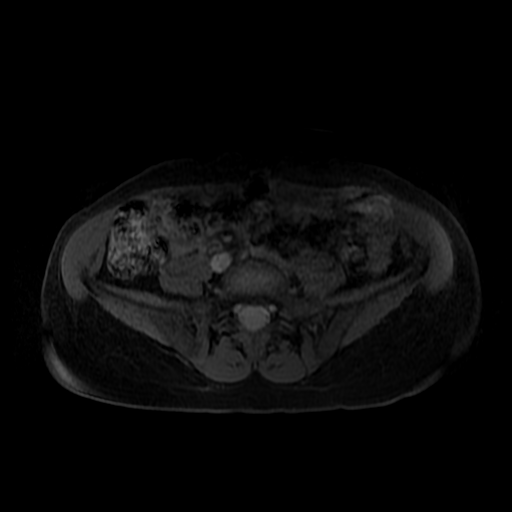
[im 60/120]
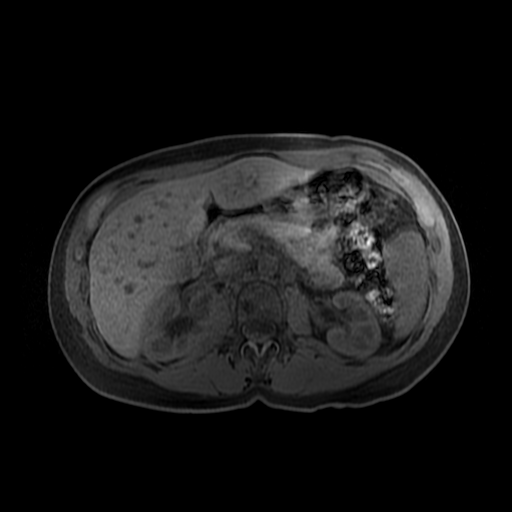
[im 120/120]
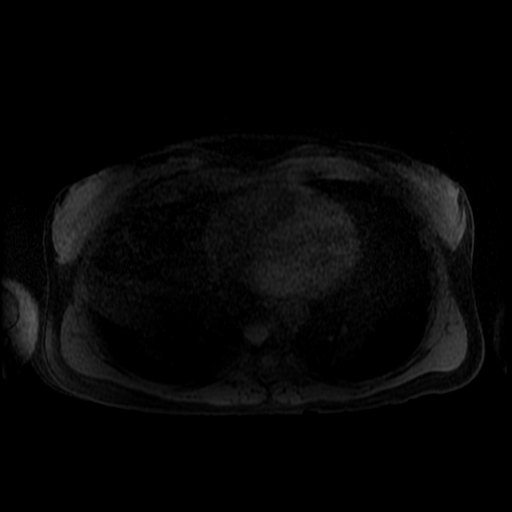

[Series 901: T1 dynamic post-contrast · axial · non-contrast · 4.0mm · 0.74mm/px · z∈[-82,+155]mm · 3 of 120 slices shown (3 of 3)]
[im 1/120]
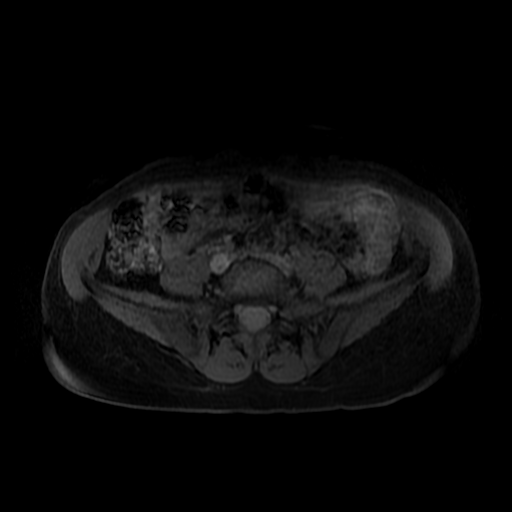
[im 60/120]
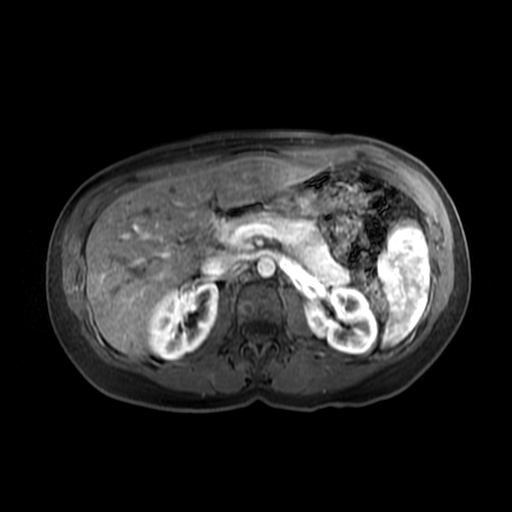
[im 120/120]
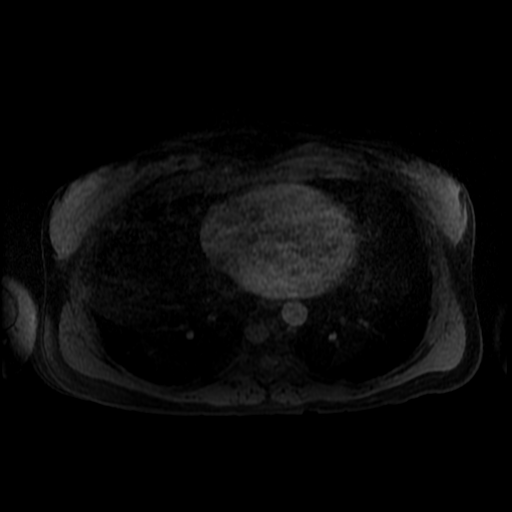

[20 of 48 positions shown; findings below may reference images not displayed]

FINDINGS: Lower chest:  Lung bases are clear.

Hepatobiliary: In the dome of the liver are several small
hyperintense subcentimeter lesions on T2 weighted imaging (image 4-8
of series 12). These lesions demonstrate no post-contrast
enhancement consistent with benign hepatic cysts. No additional
lesions are identified within the liver parenchyma. No biliary
ductal duct dilatation. Gallbladder is normal. Common bile duct
normal.

Pancreas: Normal pancreatic parenchymal intensity. No ductal
dilatation or inflammation.

Spleen: Normal spleen.

Adrenals/urinary tract: Adrenal glands and kidneys are normal.

Stomach/Bowel: Stomach and limited of the small bowel is
unremarkable

Vascular/Lymphatic: Abdominal aortic normal caliber. No
retroperitoneal periportal lymphadenopathy.

Musculoskeletal: No aggressive osseous lesion
IMPRESSION: 1. Benign hepatic cysts.
2. Normal abdominal MRI.

## 2019-07-16 ENCOUNTER — Encounter: Payer: Self-pay | Admitting: General Practice
